# Patient Record
Sex: Female | Born: 1940 | Race: White | Hispanic: No | Marital: Married | Smoking: Current every day smoker
Health system: Southern US, Community
[De-identification: ages and names within clinical notes are randomized; demographics above are authoritative.]

## PROBLEM LIST (undated history)

## (undated) DIAGNOSIS — D219 Benign neoplasm of connective and other soft tissue, unspecified: Secondary | ICD-10-CM

## (undated) DIAGNOSIS — E785 Hyperlipidemia, unspecified: Secondary | ICD-10-CM

## (undated) DIAGNOSIS — N952 Postmenopausal atrophic vaginitis: Secondary | ICD-10-CM

## (undated) DIAGNOSIS — M8580 Other specified disorders of bone density and structure, unspecified site: Secondary | ICD-10-CM

## (undated) DIAGNOSIS — K5790 Diverticulosis of intestine, part unspecified, without perforation or abscess without bleeding: Secondary | ICD-10-CM

## (undated) HISTORY — PX: OTHER SURGICAL HISTORY: SHX169

## (undated) HISTORY — PX: WISDOM TOOTH EXTRACTION: SHX21

## (undated) HISTORY — DX: Benign neoplasm of connective and other soft tissue, unspecified: D21.9

## (undated) HISTORY — DX: Postmenopausal atrophic vaginitis: N95.2

## (undated) HISTORY — DX: Hyperlipidemia, unspecified: E78.5

## (undated) HISTORY — DX: Diverticulosis of intestine, part unspecified, without perforation or abscess without bleeding: K57.90

## (undated) HISTORY — PX: TONSILLECTOMY: SUR1361

## (undated) HISTORY — DX: Other specified disorders of bone density and structure, unspecified site: M85.80

---

## 1990-03-12 HISTORY — PX: ABDOMINAL HYSTERECTOMY: SHX81

## 1997-09-30 ENCOUNTER — Other Ambulatory Visit: Admission: RE | Admit: 1997-09-30 | Discharge: 1997-09-30 | Payer: Self-pay | Admitting: Obstetrics and Gynecology

## 1998-10-03 ENCOUNTER — Other Ambulatory Visit: Admission: RE | Admit: 1998-10-03 | Discharge: 1998-10-03 | Payer: Self-pay | Admitting: Obstetrics and Gynecology

## 2000-10-02 ENCOUNTER — Other Ambulatory Visit: Admission: RE | Admit: 2000-10-02 | Discharge: 2000-10-02 | Payer: Self-pay | Admitting: Obstetrics and Gynecology

## 2000-11-15 ENCOUNTER — Encounter: Payer: Self-pay | Admitting: Internal Medicine

## 2000-11-15 ENCOUNTER — Ambulatory Visit (HOSPITAL_COMMUNITY): Admission: RE | Admit: 2000-11-15 | Discharge: 2000-11-15 | Payer: Self-pay | Admitting: Internal Medicine

## 2000-11-19 ENCOUNTER — Encounter: Payer: Self-pay | Admitting: Internal Medicine

## 2003-09-29 ENCOUNTER — Other Ambulatory Visit: Admission: RE | Admit: 2003-09-29 | Discharge: 2003-09-29 | Payer: Self-pay | Admitting: Obstetrics and Gynecology

## 2004-01-17 ENCOUNTER — Ambulatory Visit: Payer: Self-pay | Admitting: Internal Medicine

## 2004-09-29 ENCOUNTER — Other Ambulatory Visit: Admission: RE | Admit: 2004-09-29 | Discharge: 2004-09-29 | Payer: Self-pay | Admitting: Obstetrics and Gynecology

## 2005-03-12 HISTORY — PX: COLONOSCOPY: SHX174

## 2005-12-27 ENCOUNTER — Ambulatory Visit: Payer: Self-pay | Admitting: Internal Medicine

## 2006-10-17 ENCOUNTER — Other Ambulatory Visit: Admission: RE | Admit: 2006-10-17 | Discharge: 2006-10-17 | Payer: Self-pay | Admitting: Obstetrics and Gynecology

## 2006-11-08 ENCOUNTER — Encounter (INDEPENDENT_AMBULATORY_CARE_PROVIDER_SITE_OTHER): Payer: Self-pay | Admitting: *Deleted

## 2006-12-05 ENCOUNTER — Ambulatory Visit: Payer: Self-pay | Admitting: Internal Medicine

## 2006-12-05 DIAGNOSIS — E782 Mixed hyperlipidemia: Secondary | ICD-10-CM | POA: Insufficient documentation

## 2006-12-05 LAB — CONVERTED CEMR LAB
Cholesterol, target level: 200 mg/dL
HDL goal, serum: 40 mg/dL
LDL Goal: 160 mg/dL

## 2006-12-20 ENCOUNTER — Ambulatory Visit: Payer: Self-pay | Admitting: Internal Medicine

## 2006-12-23 ENCOUNTER — Encounter: Payer: Self-pay | Admitting: Internal Medicine

## 2007-01-06 ENCOUNTER — Encounter (INDEPENDENT_AMBULATORY_CARE_PROVIDER_SITE_OTHER): Payer: Self-pay | Admitting: *Deleted

## 2007-01-07 ENCOUNTER — Encounter (INDEPENDENT_AMBULATORY_CARE_PROVIDER_SITE_OTHER): Payer: Self-pay | Admitting: *Deleted

## 2007-06-03 ENCOUNTER — Ambulatory Visit: Payer: Self-pay | Admitting: Internal Medicine

## 2007-06-03 LAB — CONVERTED CEMR LAB
Fecal Occult Blood: NEGATIVE
OCCULT 1: NEGATIVE
OCCULT 2: NEGATIVE
OCCULT 3: NEGATIVE
OCCULT 4: NEGATIVE
OCCULT 5: NEGATIVE

## 2007-06-09 ENCOUNTER — Encounter (INDEPENDENT_AMBULATORY_CARE_PROVIDER_SITE_OTHER): Payer: Self-pay | Admitting: *Deleted

## 2007-08-08 ENCOUNTER — Ambulatory Visit: Payer: Self-pay | Admitting: Endocrinology

## 2007-08-08 ENCOUNTER — Encounter: Payer: Self-pay | Admitting: Internal Medicine

## 2007-08-12 ENCOUNTER — Ambulatory Visit: Payer: Self-pay | Admitting: Family Medicine

## 2007-08-12 LAB — CONVERTED CEMR LAB
Glucose, Urine, Semiquant: NEGATIVE
Ketones, urine, test strip: NEGATIVE
Specific Gravity, Urine: <1.005
pH: 7.5

## 2007-08-15 ENCOUNTER — Encounter: Admission: RE | Admit: 2007-08-15 | Discharge: 2007-08-15 | Payer: Self-pay | Admitting: Family Medicine

## 2007-08-18 ENCOUNTER — Encounter (INDEPENDENT_AMBULATORY_CARE_PROVIDER_SITE_OTHER): Payer: Self-pay | Admitting: *Deleted

## 2007-08-26 ENCOUNTER — Telehealth (INDEPENDENT_AMBULATORY_CARE_PROVIDER_SITE_OTHER): Payer: Self-pay | Admitting: *Deleted

## 2007-09-05 ENCOUNTER — Ambulatory Visit: Payer: Self-pay | Admitting: Internal Medicine

## 2007-09-05 DIAGNOSIS — M949 Disorder of cartilage, unspecified: Secondary | ICD-10-CM

## 2007-09-05 DIAGNOSIS — M899 Disorder of bone, unspecified: Secondary | ICD-10-CM | POA: Insufficient documentation

## 2007-09-05 LAB — CONVERTED CEMR LAB
Cholesterol, target level: 200 mg/dL
HDL goal, serum: 50 mg/dL
LDL Goal: 160 mg/dL

## 2007-09-09 ENCOUNTER — Telehealth (INDEPENDENT_AMBULATORY_CARE_PROVIDER_SITE_OTHER): Payer: Self-pay | Admitting: *Deleted

## 2007-09-10 ENCOUNTER — Encounter: Admission: RE | Admit: 2007-09-10 | Discharge: 2007-09-10 | Payer: Self-pay | Admitting: Internal Medicine

## 2007-09-15 ENCOUNTER — Ambulatory Visit: Payer: Self-pay | Admitting: Internal Medicine

## 2007-09-15 LAB — CONVERTED CEMR LAB
ALT: 12 units/L (ref 0–35)
Basophils Absolute: 0 10*3/uL (ref 0.0–0.1)
Basophils Relative: 0.6 % (ref 0.0–1.0)
CO2: 30 meq/L (ref 19–32)
Calcium: 9.1 mg/dL (ref 8.4–10.5)
Cholesterol: 233 mg/dL (ref 0–200)
Creatinine, Ser: 0.9 mg/dL (ref 0.4–1.2)
Direct LDL: 127.2 mg/dL
Free T4: 1 ng/dL (ref 0.6–1.6)
GFR calc Af Amer: 81 mL/min
HCT: 44.2 % (ref 36.0–46.0)
Hemoglobin: 15 g/dL (ref 12.0–15.0)
Lymphocytes Relative: 33.1 % (ref 12.0–46.0)
MCHC: 33.9 g/dL (ref 30.0–36.0)
MCV: 97.9 fL (ref 78.0–100.0)
Monocytes Absolute: 0.5 10*3/uL (ref 0.1–1.0)
Neutro Abs: 3.4 10*3/uL (ref 1.4–7.7)
RBC: 4.51 M/uL (ref 3.87–5.11)
RDW: 12.7 % (ref 11.5–14.6)
TSH: 0.97 microintl units/mL (ref 0.35–5.50)
Total Bilirubin: 1 mg/dL (ref 0.3–1.2)
Total Protein: 6.6 g/dL (ref 6.0–8.3)
VLDL: 14 mg/dL (ref 0–40)

## 2007-09-22 ENCOUNTER — Encounter (INDEPENDENT_AMBULATORY_CARE_PROVIDER_SITE_OTHER): Payer: Self-pay | Admitting: *Deleted

## 2007-09-23 ENCOUNTER — Encounter: Payer: Self-pay | Admitting: Internal Medicine

## 2007-10-20 ENCOUNTER — Other Ambulatory Visit: Admission: RE | Admit: 2007-10-20 | Discharge: 2007-10-20 | Payer: Self-pay | Admitting: Obstetrics and Gynecology

## 2007-10-29 ENCOUNTER — Encounter: Payer: Self-pay | Admitting: Internal Medicine

## 2007-11-19 ENCOUNTER — Encounter: Payer: Self-pay | Admitting: Internal Medicine

## 2007-12-11 ENCOUNTER — Encounter: Payer: Self-pay | Admitting: Internal Medicine

## 2008-02-09 ENCOUNTER — Encounter (INDEPENDENT_AMBULATORY_CARE_PROVIDER_SITE_OTHER): Payer: Self-pay | Admitting: *Deleted

## 2008-02-13 ENCOUNTER — Ambulatory Visit: Payer: Self-pay | Admitting: Internal Medicine

## 2008-04-01 ENCOUNTER — Ambulatory Visit: Payer: Self-pay | Admitting: Internal Medicine

## 2008-04-01 DIAGNOSIS — M2550 Pain in unspecified joint: Secondary | ICD-10-CM | POA: Insufficient documentation

## 2008-04-06 ENCOUNTER — Encounter (INDEPENDENT_AMBULATORY_CARE_PROVIDER_SITE_OTHER): Payer: Self-pay | Admitting: *Deleted

## 2008-04-06 LAB — CONVERTED CEMR LAB: Uric Acid, Serum: 3.8 mg/dL (ref 2.4–7.0)

## 2008-05-17 ENCOUNTER — Telehealth: Payer: Self-pay | Admitting: Internal Medicine

## 2008-10-22 ENCOUNTER — Ambulatory Visit: Payer: Self-pay | Admitting: Obstetrics and Gynecology

## 2009-01-06 ENCOUNTER — Encounter: Payer: Self-pay | Admitting: Internal Medicine

## 2009-03-17 ENCOUNTER — Ambulatory Visit: Payer: Self-pay | Admitting: Internal Medicine

## 2009-03-17 DIAGNOSIS — K5730 Diverticulosis of large intestine without perforation or abscess without bleeding: Secondary | ICD-10-CM | POA: Insufficient documentation

## 2009-03-18 ENCOUNTER — Ambulatory Visit: Payer: Self-pay | Admitting: Internal Medicine

## 2009-03-18 LAB — CONVERTED CEMR LAB: Vit D, 25-Hydroxy: 41 ng/mL (ref 30–89)

## 2009-03-19 LAB — CONVERTED CEMR LAB
ALT: 14 units/L (ref 0–35)
AST: 18 units/L (ref 0–37)
Albumin: 3.8 g/dL (ref 3.5–5.2)
BUN: 12 mg/dL (ref 6–23)
Basophils Relative: 0.6 % (ref 0.0–3.0)
Chloride: 109 meq/L (ref 96–112)
Cholesterol: 228 mg/dL — ABNORMAL HIGH (ref 0–200)
Eosinophils Relative: 5 % (ref 0.0–5.0)
Glucose, Bld: 91 mg/dL (ref 70–99)
HCT: 44.1 % (ref 36.0–46.0)
Hemoglobin: 14.6 g/dL (ref 12.0–15.0)
Lymphs Abs: 2.2 10*3/uL (ref 0.7–4.0)
MCV: 98.8 fL (ref 78.0–100.0)
Monocytes Absolute: 0.5 10*3/uL (ref 0.1–1.0)
Neutro Abs: 3.2 10*3/uL (ref 1.4–7.7)
Potassium: 4.1 meq/L (ref 3.5–5.1)
RBC: 4.46 M/uL (ref 3.87–5.11)
WBC: 6.2 10*3/uL (ref 4.5–10.5)

## 2009-03-21 ENCOUNTER — Encounter (INDEPENDENT_AMBULATORY_CARE_PROVIDER_SITE_OTHER): Payer: Self-pay | Admitting: *Deleted

## 2009-03-30 ENCOUNTER — Encounter: Payer: Self-pay | Admitting: Internal Medicine

## 2009-10-20 ENCOUNTER — Encounter (INDEPENDENT_AMBULATORY_CARE_PROVIDER_SITE_OTHER): Payer: Self-pay | Admitting: *Deleted

## 2009-10-24 ENCOUNTER — Telehealth: Payer: Self-pay | Admitting: Gastroenterology

## 2009-12-14 ENCOUNTER — Encounter: Payer: Self-pay | Admitting: Internal Medicine

## 2009-12-15 ENCOUNTER — Ambulatory Visit: Payer: Self-pay | Admitting: Family Medicine

## 2009-12-19 ENCOUNTER — Other Ambulatory Visit: Admission: RE | Admit: 2009-12-19 | Discharge: 2009-12-19 | Payer: Self-pay | Admitting: Obstetrics and Gynecology

## 2009-12-19 ENCOUNTER — Ambulatory Visit: Payer: Self-pay | Admitting: Obstetrics and Gynecology

## 2010-04-11 NOTE — Progress Notes (Signed)
Summary: ? re recall col  Phone Note Call from Patient Call back at Home Phone [phone]   Caller: Patient Call For: Russella Dar Reason for Call: Talk to Nurse Summary of Call: Patient wants to speak to nurse regarding recall col, states that she was under the impression that if you had no polyp or hx the recall was for ten years not five. Initial call taken by: Tawni Levy,  October 24, 2009 3:41 PM  Follow-up for Phone Call        Reviewed prior colon from 11/2003 with patient was a mederately dificult exam and that is why the recommendation for a 5 year recall.  Patient  states she will think about it and call back if she is interested. Follow-up by: Darcey Nora RN, CGRN,  October 24, 2009 3:55 PM

## 2010-04-11 NOTE — Assessment & Plan Note (Signed)
Summary: cpx/ns/kdc   Vital Signs:  Patient profile:   70 year old female Height:      63 inches Weight:      125.6 pounds BMI:     22.33 Temp:     98.6 degrees F oral Resp:     14 per minute BP sitting:   122 / 80  (left arm) Cuff size:   regular  Vitals Entered By: Shonna Chock (March 17, 2009 1:46 PM)  Comments REVIEWED MED LIST, PATIENT AGREED DOSE AND INSTRUCTION CORRECT    Primary Care Provider:  Alwyn Ren   History of Present Illness: Karen Mccoy is here for a preventive care exam; she is asymptomatic.  Preventive Screening-Counseling & Management  Alcohol-Tobacco     Smoking Status: current  Caffeine-Diet-Exercise     Does Patient Exercise: yes  Allergies: 1)  ! Pcn  Past History:  Past Medical History: Osteopenia : Tscore -2.2 @ R femoral neck 01/06/2009 , Dr Eda Paschal (improved @ all sites) Aortic bruit w/o AAA; Thyroid nodule, S/P tonsilar irradiation Diverticulosis, colon Hyperlipidemia: LDL 161(0960/454), TG 94, HDL 78; LDL goal = < 160, ideally < 130  Past Surgical History: Colonoscopy : tics  ;Hysterectomy for fibroid ( ovaries remain) Tonsillectomy; G2 P2  Family History: Father: lipids, CAD ,CBAG,pacer, died 2008-12-07 Resp Failure, CHF, Renal Failure Mother: lipids, HTN,osteopenia Siblings:  bro lipids MGF MI in 46s; MGM MI pre 31; PGM COAD  Social History: heart healthy diet Retired Married Current Smoker: 3-4 / day Alcohol use-yes: socially  Regular exercise-yes: 3-5X /week as tennis (doubles)  Review of Systems       The patient complains of decreased hearing.  The patient denies anorexia, fever, weight loss, weight gain, vision loss, hoarseness, chest pain, syncope, dyspnea on exertion, peripheral edema, prolonged cough, headaches, hemoptysis, abdominal pain, melena, hematochezia, severe indigestion/heartburn, hematuria, incontinence, suspicious skin lesions, depression, unusual weight change, abnormal bleeding, enlarged lymph nodes, and  angioedema.         L ear hearing loss by 1/3 post infections.  Physical Exam  General:  Thin but well-nourished,in no acute distress; alert,appropriate and cooperative throughout examination Head:  Normocephalic and atraumatic without obvious abnormalities.  Eyes:  No corneal or conjunctival inflammation noted. Perrla. Funduscopic exam benign, without hemorrhages, exudates or papilledema.  Ears:  External ear exam shows no significant lesions or deformities.  Otoscopic examination reveals clear canals, tympanic membranes are intact bilaterally without bulging, retraction, inflammation or discharge. L TM  dull Nose:  External nasal examination shows no deformity or inflammation. Nasal mucosa are pink and moist without lesions or exudates. Mouth:  Oral mucosa and oropharynx without lesions or exudates.  Teeth in good repair. Neck:  No deformities, masses, or tenderness noted. NO NODULES  Lungs:  Normal respiratory effort, chest expands symmetrically. Lungs are clear to auscultation, no crackles or wheezes. Heart:  Normal rate and regular rhythm. S1 and S2 normal without gallop, murmur, click, rub . S4 with slurring Abdomen:  Bowel sounds positive,abdomen soft and non-tender without masses, organomegaly or hernias noted. No bruit Genitalia:  Dr Eda Paschal Msk:  No deformity or scoliosis noted of thoracic or lumbar spine.   Pulses:  R and L carotid,radial,dorsalis pedis and posterior tibial pulses are full and equal bilaterally Extremities:  No clubbing, cyanosis, edema, or deformity noted with normal full range of motion of all joints.   Neurologic:  alert & oriented X3 and DTRs symmetrical and normal.   Skin:  Intact without suspicious lesions or rashes Cervical Nodes:  No lymphadenopathy noted Axillary Nodes:  No palpable lymphadenopathy Psych:  memory intact for recent and remote, normally interactive, and good eye contact.     Impression & Recommendations:  Problem # 1:  PREVENTIVE  HEALTH CARE (ICD-V70.0)  Orders: EKG w/ Interpretation (93000)  Problem # 2:  OSTEOPENIA (ICD-733.90)  Problem # 3:  HYPERLIPIDEMIA (ICD-272.2)  Orders: EKG w/ Interpretation (93000)  Problem # 4:  THYROID NODULE, HX OF (ICD-V12.2) Not clinically present; PMH of tonsillar irradiation  Complete Medication List: 1)  Alprazolam 0.25 Mg Tabs (Alprazolam) .... 1/2 -1 q 8-12 hrs as needed only, do not take routinely 2)  Vagifem 10 Mcg Tabs (Estradiol) .... 4 per week  Patient Instructions: 1)  Schedule fasting labs: 2)  BMP ; 3)  Hepatic Panel; 4)  Lipid Panel ; 5)  TSH ;free T4; 6)  CBC w/ Diff; vitamin D level. Prescriptions: ALPRAZOLAM 0.25 MG TABS (ALPRAZOLAM) 1/2 -1 q 8-12 hrs as needed only, do not take routinely  #90 x 1   Entered and Authorized by:   Marga Melnick MD   Signed by:   Marga Melnick MD on 03/17/2009   Method used:   Print then Give to Patient   RxID:   [id number]

## 2010-04-11 NOTE — Letter (Signed)
Summary: Results Follow up Letter  [place] [facility]  [address] [place], [place] [address]   Phone: [phone]  Fax: [phone]    03/21/2009 MRN: [id number]  JANUS VLCEK [address] [place], [place]  [address]  Dear Ms. Vandenheuvel,  The following are the results of your recent test(s):  Test         Result    Pap Smear:        Normal _____  Not Normal _____ Comments: ______________________________________________________ Cholesterol: LDL(Bad cholesterol):         Your goal is less than:         HDL (Good cholesterol):       Your goal is more than: Comments:  ______________________________________________________ Mammogram:        Normal _____  Not Normal _____ Comments:  ___________________________________________________________________ Hemoccult:        Normal _____  Not normal _______ Comments:    _____________________________________________________________________ Other Tests: PLEASE SEE COPY OF LABS FROM 03/18/09 AND COMMENTS    We routinely do not discuss normal results over the telephone.  If you desire a copy of the results, or you have any questions about this information we can discuss them at your next office visit.   Sincerely,

## 2010-04-11 NOTE — Letter (Signed)
Summary: Colonoscopy Letter   Gastroenterology  [address] [place], [place] [address]   Phone: [phone]  Fax: [phone]      October 20, 2009 MRN: [id number]   Karen Mccoy [address] [place], [place]  [address]   Dear Ms. Edgell,   According to your medical record, it is time for you to schedule a Colonoscopy. The American Cancer Society recommends this procedure as a method to detect early colon cancer. Patients with a family history of colon cancer, or a personal history of colon polyps or inflammatory bowel disease are at increased risk.  This letter has beeen generated based on the recommendations made at the time of your procedure. If you feel that in your particular situation this may no longer apply, please contact our office.  Please call our office at [phone] to schedule this appointment or to update your records at your earliest convenience.  Thank you for cooperating with [place] to provide you with the very best care possible.   Sincerely,  Judie Petit T. Russella Dar, M.D.  [facility] [phone]

## 2010-04-11 NOTE — Miscellaneous (Signed)
Summary: Health Care Power of [facility] Power of Attorney   Imported By: Lanelle Bal 06/24/2009 08:33:45  _____________________________________________________________________  External Attachment:    Type:   Image     Comment:   External Document

## 2010-04-11 NOTE — Miscellaneous (Signed)
Summary: Desire for a Natural Death  Desire for a Natural Death   Imported By: Lanelle Bal 03/23/2009 12:57:20  _____________________________________________________________________  External Attachment:    Type:   Image     Comment:   External Document

## 2010-04-11 NOTE — Miscellaneous (Signed)
Summary: Living Will  Living Will   Imported By: Lanelle Bal 06/24/2009 08:36:44  _____________________________________________________________________  External Attachment:    Type:   Image     Comment:   External Document

## 2010-04-11 NOTE — Assessment & Plan Note (Signed)
Summary: broken toe? /cbs   Vital Signs:  Patient profile:   70 year old female Height:      63 inches Weight:      118 pounds BMI:     20.98 Pulse rate:   82 / minute BP sitting:   112 / 70  (left arm)  Vitals Entered By: Doristine Devoid CMA (December 15, 2009 8:53 AM) CC: 2nd toe on L foot painful   History of Present Illness: 70 yo woman here today w/ L toe pain.  tripped on the rug and 'toe crumpled'.  occured last night.  toe was throbbing 'all night' and is now 'black and blue'.  iced area this AM.  hasn't taken anything for pain.  wants to know if she can play tennis this weekend.  Current Medications (verified): 1)  Alprazolam 0.25 Mg Tabs (Alprazolam) .... 1/2 -1 Q 8-12 Hrs As Needed Only, Do Not Take Routinely 2)  Vagifem 10 Mcg Tabs (Estradiol) .... 4 Per Week 3)  Naprosyn 500 Mg Tabs (Naproxen) .Marland Kitchen.. 1 Two Times A Day X7 Days and Then As Needed.  Take W/ Food.  Allergies (verified): 1)  ! Pcn  Review of Systems      See HPI  Physical Exam  General:  Thin but well-nourished,in no acute distress; alert,appropriate and cooperative throughout examination Pulses:  +2 DP/PT pulses Extremities:  bruising and mild swelling of L 2nd toe just below nail.  no dislocation, no angulation, good mobility of DIP/PIP joints   Impression & Recommendations:  Problem # 1:  TOE PAIN (ICD-729.5) Assessment New  fx vs bruise.  offered pt xray- she declined.  start NSAIDs, ice.  discussed ability to play this weekend- if able to make cuts laterally she can play, otherwise will need to stop.  Pt expresses understanding and is in agreement w/ this plan.  Orders: Prescription Created Electronically [phone])  Complete Medication List: 1)  Alprazolam 0.25 Mg Tabs (Alprazolam) .... 1/2 -1 q 8-12 hrs as needed only, do not take routinely 2)  Vagifem 10 Mcg Tabs (Estradiol) .... 4 per week 3)  Naprosyn 500 Mg Tabs (Naproxen) .Marland Kitchen.. 1 two times a day x7 days and then as needed.  take w/  food.  Other Orders: Pneumococcal Vaccine (02542) Admin 1st Vaccine (70623)  Patient Instructions: 1)  This appears to be a bruised toe 2)  Ice for 15-20 minutes as often as possible 3)  Take the Naprosyn as directed for pain and inflammation- take w/ food to avoid upset stomach 4)  Tylenol in between doses as needed for pain 5)  Keep toe protected w/ sturdy shoes 6)  If you are able to cut from side to side and front to back tomorrow morning there should be no reason you can't play.  But if it is too uncomfortable, maybe sit this weekend out! 7)  Hang in there!!! Prescriptions: NAPROSYN 500 MG TABS (NAPROXEN) 1 two times a day x7 days and then as needed.  take w/ food.  #60 x 0   Entered and Authorized by:   Neena Rhymes MD   Signed by:   Neena Rhymes MD on 12/15/2009   Method used:   Electronically to        [facility]  [phone]* (retail)       [address] [place], [place]  [address]       Ph: [phone] or [phone]       Fax:  [phone]   RxID:   [id number]    Immunization History:  Tetanus/Td Immunization History:    Tetanus/Td:  historical (12/05/2009)  Immunizations Administered:  Pneumonia Vaccine:    Vaccine Type: Pneumovax    Site: left deltoid    Mfr: Merck    Dose: 0.5 ml    Route: IM    Given by: Doristine Devoid CMA    Exp. Date: 05/29/2011    Lot #: [id number]

## 2010-04-14 ENCOUNTER — Encounter (INDEPENDENT_AMBULATORY_CARE_PROVIDER_SITE_OTHER): Payer: Self-pay | Admitting: *Deleted

## 2010-04-14 ENCOUNTER — Encounter: Payer: Self-pay | Admitting: Internal Medicine

## 2010-04-14 ENCOUNTER — Other Ambulatory Visit (INDEPENDENT_AMBULATORY_CARE_PROVIDER_SITE_OTHER): Payer: Medicare Other

## 2010-04-14 ENCOUNTER — Ambulatory Visit: Admit: 2010-04-14 | Payer: Self-pay | Admitting: Internal Medicine

## 2010-04-14 ENCOUNTER — Other Ambulatory Visit: Payer: Self-pay | Admitting: Internal Medicine

## 2010-04-14 DIAGNOSIS — Z862 Personal history of diseases of the blood and blood-forming organs and certain disorders involving the immune mechanism: Secondary | ICD-10-CM

## 2010-04-14 DIAGNOSIS — E782 Mixed hyperlipidemia: Secondary | ICD-10-CM

## 2010-04-14 DIAGNOSIS — Z0000 Encounter for general adult medical examination without abnormal findings: Secondary | ICD-10-CM

## 2010-04-14 DIAGNOSIS — Z8639 Personal history of other endocrine, nutritional and metabolic disease: Secondary | ICD-10-CM

## 2010-04-14 DIAGNOSIS — E785 Hyperlipidemia, unspecified: Secondary | ICD-10-CM

## 2010-04-14 LAB — CBC WITH DIFFERENTIAL/PLATELET
Basophils Absolute: 0 10*3/uL (ref 0.0–0.1)
Basophils Relative: 0.5 % (ref 0.0–3.0)
Eosinophils Absolute: 0.4 10*3/uL (ref 0.0–0.7)
HCT: 45.2 % (ref 36.0–46.0)
Hemoglobin: 15.4 g/dL — ABNORMAL HIGH (ref 12.0–15.0)
Lymphs Abs: 2.3 10*3/uL (ref 0.7–4.0)
MCHC: 34.2 g/dL (ref 30.0–36.0)
MCV: 96.8 fl (ref 78.0–100.0)
Neutro Abs: 3.9 10*3/uL (ref 1.4–7.7)
RBC: 4.67 Mil/uL (ref 3.87–5.11)
RDW: 13.9 % (ref 11.5–14.6)

## 2010-04-14 LAB — BASIC METABOLIC PANEL
Calcium: 9.2 mg/dL (ref 8.4–10.5)
Chloride: 106 mEq/L (ref 96–112)
Creatinine, Ser: 0.8 mg/dL (ref 0.4–1.2)
GFR: 78.15 mL/min (ref >60.00)

## 2010-04-14 LAB — LIPID PANEL
Total CHOL/HDL Ratio: 3
VLDL: 13.8 mg/dL (ref 0.0–40.0)

## 2010-04-14 LAB — HEPATIC FUNCTION PANEL
Alkaline Phosphatase: 51 U/L (ref 39–117)
Bilirubin, Direct: 0.2 mg/dL (ref 0.0–0.3)
Total Bilirubin: 0.6 mg/dL (ref 0.3–1.2)
Total Protein: 6.4 g/dL (ref 6.0–8.3)

## 2010-04-26 ENCOUNTER — Encounter: Payer: Self-pay | Admitting: Internal Medicine

## 2010-04-26 ENCOUNTER — Encounter (INDEPENDENT_AMBULATORY_CARE_PROVIDER_SITE_OTHER): Payer: Medicare Other | Admitting: Internal Medicine

## 2010-04-26 DIAGNOSIS — Z136 Encounter for screening for cardiovascular disorders: Secondary | ICD-10-CM

## 2010-04-26 DIAGNOSIS — R6884 Jaw pain: Secondary | ICD-10-CM

## 2010-04-26 DIAGNOSIS — R9431 Abnormal electrocardiogram [ECG] [EKG]: Secondary | ICD-10-CM

## 2010-04-26 DIAGNOSIS — Z0000 Encounter for general adult medical examination without abnormal findings: Secondary | ICD-10-CM

## 2010-04-26 DIAGNOSIS — M949 Disorder of cartilage, unspecified: Secondary | ICD-10-CM

## 2010-04-26 DIAGNOSIS — E782 Mixed hyperlipidemia: Secondary | ICD-10-CM

## 2010-04-28 ENCOUNTER — Encounter: Payer: Self-pay | Admitting: Physician Assistant

## 2010-04-28 ENCOUNTER — Encounter (INDEPENDENT_AMBULATORY_CARE_PROVIDER_SITE_OTHER): Payer: Medicare Other | Admitting: Physician Assistant

## 2010-04-28 DIAGNOSIS — R9431 Abnormal electrocardiogram [ECG] [EKG]: Secondary | ICD-10-CM

## 2010-05-03 NOTE — Assessment & Plan Note (Signed)
Summary: yearly exam and discuss labs/sph   Vital Signs:  Patient profile:   70 year old female Height:      63.25 inches Weight:      118.4 pounds BMI:     20.88 Temp:     98.9 degrees F oral Pulse rate:   76 / minute Resp:     14 per minute BP sitting:   110 / 68  (left arm) Cuff size:   regular  Vitals Entered By: Shonna Chock CMA (April 26, 2010 10:57 AM) CC: CPX, discuss labs (copies given)   Vision Screening:      Vision Comments: Recent eye exam at [facility] 02/2010   Primary Care Provider:  Alwyn Ren  CC:  CPX and discuss labs (copies given) .  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  Referral Coordination: Bruce Stress Test because of jaw pain & new T changes III & aVF 12.  Care Plan:see Instructions 13. Cognitive Assessment:Oriented X3; memory & recall excellent   ; "WORLD" spelled backwards; mood & affect normal.                                                                                                                                                    See EKG: asymptomatic T wave inversion III & aVF , not present 03/2009. Dr Toni Arthurs, DDS , has found no dental etiology for jaw pain intermittently.   Preventive Screening-Counseling & Management  Alcohol-Tobacco     Alcohol drinks/day: 1-2     Smoking Status: current     Packs/Day: 1/5 of pack     Year Started: 1957  Caffeine-Diet-Exercise     Caffeine use/day: 2 cups coffee; 1 glass  tea daily    Hep-HIV-STD-Contraception     Dental Visit-last 6 months yes     Sun Exposure-Excessive: no  Safety-Violence-Falls     Seat Belt Use: yes     Smoke Detectors: yes      Blood Transfusions:  no.        Travel History:  2008 [place].    Current Medications (verified): 1)  Alprazolam 0.25 Mg Tabs (Alprazolam) .... 1/2 -1 Q 8-12 Hrs As Needed Only, Do Not Take Routinely 2)  Vagifem 10 Mcg Tabs (Estradiol) .... 4 Per Week  Allergies: 1)  ! Pcn  Past History:  Past Medical History: Osteopenia : Tscore -2.2 @ R femoral neck 01/06/2009 , Dr Dannielle Huh  [place] ( T scores improved @ all sites) Aortic bruit w/o AAA;  S/P tonsilar irradiation @ age 50 Diverticulosis, colon Hyperlipidemia:NMR Lipoprofile : LDL 142 (1174/542), TG 94, HDL 78; LDL goal = < 160, ideally < 130  Past Surgical History: Colonoscopy 2005 : tics, Dr Claudette Head  ;Hysterectomy for uterine  fibroid ( ovaries [phone], Dr Eda Paschal Tonsillectomy; G2 P2  Family History: Father:elevated  lipids, CAD ,CBAG,pacer, died December 14, 2008 Resp Failure, CHF, Renal Failure Mother:elevated  lipids, HTN,osteopenia Siblings:  NW:GNFAOZHY  lipids MGF: MI in 41s; MGM : MI pre 69; PGM: COAD  Social History: heart healthy diet Retired Married Current Smoker: 3-4 cigarettes / day Alcohol use-yes: socially  Regular exercise-yes: 4-5 X /week as tennis (doubles) for > 60 min w/o symptoms Packs/Day:  1/5 of pack Caffeine use/day:  2 cups coffee; 1 glass  tea daily  Dental Care w/in 6 mos.:  yes Sun Exposure-Excessive:  no Seat Belt Use:  yes Blood Transfusions:  no  Review of Systems  The patient denies anorexia, fever, weight loss, weight gain, vision loss, hoarseness, chest pain, syncope, dyspnea on exertion, peripheral edema, prolonged cough, headaches, hemoptysis, abdominal pain, melena, hematochezia, severe indigestion/heartburn, hematuria, suspicious skin lesions, depression, unusual weight change, abnormal bleeding,  enlarged lymph nodes, and angioedema.         She has non exertional  pain in jaws rarely ; evaluation including Panorex  by Dr Toni Arthurs was negative. Occasioanl am neck pain despite changing pillows  Physical Exam  General:  well-nourished;alert,appropriate and cooperative throughout examination Head:  Normocephalic and atraumatic without obvious abnormalities.  Eyes:  No corneal or conjunctival inflammation noted. Perrla. Funduscopic exam benign, without hemorrhages, exudates or papilledema.  Ears:  External ear exam shows no significant lesions or deformities.  Otoscopic examination reveals clear canals, tympanic membranes are intact bilaterally without bulging, retraction, inflammation or discharge. Hearing is grossly normal bilaterally. Nose:  External nasal examination shows no deformity or inflammation. Nasal mucosa are pink and moist without lesions or exudates. Mouth:  Oral mucosa and oropharynx without lesions or exudates.  Teeth in good repair. Neck:  No deformities, masses, or tenderness noted. R thyroid lobe small; no nodules Lungs:  Normal respiratory effort, chest expands symmetrically. Lungs are clear to auscultation, no crackles or wheezes. Heart:  Normal rate and regular rhythm. S1 and S2 normal without gallop, murmur, click, rub . Soft S4 with slurring Abdomen:  Bowel sounds positive,abdomen soft and non-tender without masses, organomegaly or hernias noted. Rectal:  given stool cards Genitalia:  Dr Eda Paschal Msk:  No deformity or scoliosis noted of thoracic or lumbar spine.   Pulses:  R and L carotid,radial,dorsalis pedis and posterior tibial pulses are full and equal bilaterally Extremities:  No clubbing, cyanosis, edema, or deformity noted with normal full range of motion of all joints.   Neurologic:  alert & oriented X3 and DTRs symmetrical and normal.   Skin:  Intact without suspicious lesions or rashes Cervical Nodes:  No lymphadenopathy noted Axillary Nodes:  No  palpable lymphadenopathy Psych:  memory intact for recent and remote, normally interactive, and good eye contact.     Impression & Recommendations:  Problem # 1:  PREVENTIVE HEALTH CARE (ICD-V70.0)  Orders: Medicare -1st Annual Wellness Visit [phone])  Problem # 2:  JAW PAIN (ION-629.52)  neg dental evaluation  Orders: Misc. Referral (Misc. Ref) EKG w/ Interpretation (93000)  Problem # 3:  NONSPECIFIC ABNORMAL ELECTROCARDIOGRAM (ICD-794.31)  T wave inversion III & aVF , new since 03/2009  Orders:  Misc. Referral (Misc. Ref)  Problem # 4:  HYPERLIPIDEMIA (ICD-272.2)  Orders: EKG w/ Interpretation (93000)  Problem # 5:  OSTEOPENIA (ICD-733.90)  Complete Medication List: 1)  Alprazolam 0.25 Mg Tabs (Alprazolam) .... 1/2 -1 q 8-12 hrs as needed only, do not take routinely 2)  Vagifem 10 Mcg Tabs (Estradiol) .... 4 per week  Patient Instructions: 1)   BMD every 25 months.Complete stool cards. 2)  It is important that you exercise regularly at least 20 minutes 5 times a week. If you develop chest pain, have severe difficulty breathing, or feel very tired , stop exercising immediately and seek medical attention. 3)  Complete stool cards. Please  schedule a colonoscopy  if  Dr Russella Dar  feels it is necessary even if stool cards are negative.Positive stool cards would make colonscopy mandatory. 4)  Stop Smoking Tips: Choose a Quit date. Cut down before the Quit date. decide what you will do as a substitute when you feel the urge to smoke(gum,toothpick,exercise).   Orders Added: 1)  Medicare -1st Annual Wellness Visit [G0438] 2)  Est. Patient Level III [[address]] 3)  Misc. Referral [Misc. Ref] 4)  EKG w/ Interpretation [93000]

## 2010-06-09 ENCOUNTER — Other Ambulatory Visit: Payer: Self-pay | Admitting: Internal Medicine

## 2010-06-09 MED ORDER — ALPRAZOLAM 0.25 MG PO TABS: ORAL_TABLET | ORAL | Status: DC

## 2010-06-13 ENCOUNTER — Other Ambulatory Visit: Payer: Self-pay | Admitting: Internal Medicine

## 2010-06-13 ENCOUNTER — Other Ambulatory Visit: Payer: Medicare Other

## 2010-06-13 DIAGNOSIS — Z1289 Encounter for screening for malignant neoplasm of other sites: Secondary | ICD-10-CM

## 2010-06-13 LAB — HEMOCCULT SLIDES (X 3 CARDS)
Fecal Occult Blood: NEGATIVE
OCCULT 2: NEGATIVE
OCCULT 3: NEGATIVE

## 2010-10-31 ENCOUNTER — Other Ambulatory Visit: Payer: Self-pay | Admitting: Orthopedic Surgery

## 2010-12-11 ENCOUNTER — Ambulatory Visit (INDEPENDENT_AMBULATORY_CARE_PROVIDER_SITE_OTHER): Payer: Managed Care, Other (non HMO) | Admitting: Internal Medicine

## 2010-12-11 ENCOUNTER — Encounter: Payer: Self-pay | Admitting: Internal Medicine

## 2010-12-11 DIAGNOSIS — R3915 Urgency of urination: Secondary | ICD-10-CM

## 2010-12-11 DIAGNOSIS — R1032 Left lower quadrant pain: Secondary | ICD-10-CM

## 2010-12-11 DIAGNOSIS — R140 Abdominal distension (gaseous): Secondary | ICD-10-CM

## 2010-12-11 DIAGNOSIS — R143 Flatulence: Secondary | ICD-10-CM

## 2010-12-11 LAB — POCT URINALYSIS DIPSTICK
Bilirubin, UA: NEGATIVE
Blood, UA: NEGATIVE
Ketones, UA: NEGATIVE
pH, UA: 6

## 2010-12-11 NOTE — Progress Notes (Signed)
  Subjective:    Patient ID: Karen Mccoy, female    DOB: [birth year], 70 y.o.   MRN: [id number]  HPI ABDOMINAL PAIN: Location: LLQ  Onset: 3 weeks ago   Radiation: no  Severity: up to 2 Quality: dull  Duration: only with direct pressure  Better with: resolves when pressure removed  Worse with: no other factors Symptoms Nausea/Vomiting: no  Diarrhea: no  Constipation: no  Melena/BRBPR: no   Anorexia: no  Fever/Chills: no  Dysuria/hematuria/pyuria: no, but frequency with oliguria in afternoon  then normal volume thereafter Rash: no  Wt loss: no  Vaginal bleeding: no    Past Surgeries: TAH w/o BSO for uterine fibroid 1992; last colonoscopy 2007: negative except "tortuousity". No PMH of GU issues       Review of Systems   She questions whether she may have injured her abdomen playing tennis. She describes bloating throughout the summer. She her dress size increased from 4 to a 6 during that time.     Objective:   Physical Exam General appearance : thin but  good health and nourishment w/o distress.  Eyes: No conjunctival inflammation or scleral icterus is present.  Oral exam: Dental hygiene is good; lips and gums are healthy appearing.There is no oropharyngeal erythema or exudate noted.   Heart:  Normal rate and regular rhythm. S1 and S2 normal without gallop, murmur, click, rub or other extra sounds     Lungs:Chest clear to auscultation; no wheezes, rhonchi,rales ,or rubs present.No increased work of breathing.   Abdomen: bowel sounds normal, soft  But tender  LLQ without masses, organomegaly or hernias noted.  No guarding or rebound . An aortic bruit is noted; there is no aneurysm.  Skin:Warm & dry.  Intact without suspicious lesions or rashes ; no jaundice or tenting  Lymphatic: No lymphadenopathy is noted about the head, neck, axilla, or inguinal areas.   Musculoskeletal: There is exceptionally good range of motion of the lower extremities with negative straight  leg raising past 90.             Assessment & Plan:  #1 left lower quadrant abdominal pain; this is actually localized tenderness to palpation  #2 bloating for several months with associated increase in dress size  #3 some urgency and temporary oliguria, recurrent  Plan: See orders and recommendations

## 2010-12-11 NOTE — Patient Instructions (Signed)
.  Share results with Dr Eda Paschal .

## 2010-12-12 ENCOUNTER — Telehealth: Payer: Self-pay | Admitting: Internal Medicine

## 2010-12-12 NOTE — Telephone Encounter (Signed)
I do not want to get a transvaginal;can't  they  do ultrasound of abdomen to assess the ovary. If not ; I'll order a  non-OB  Trans vaginal study.

## 2010-12-12 NOTE — Telephone Encounter (Signed)
Dr. Alwyn Ren, for Pelvic ultrasounds, an order for NON-OB TRANS VAGINAL U/S is also required to be entered please.

## 2010-12-13 ENCOUNTER — Other Ambulatory Visit: Payer: Self-pay | Admitting: Internal Medicine

## 2010-12-13 DIAGNOSIS — R1032 Left lower quadrant pain: Secondary | ICD-10-CM

## 2010-12-13 DIAGNOSIS — R140 Abdominal distension (gaseous): Secondary | ICD-10-CM

## 2010-12-13 NOTE — Telephone Encounter (Signed)
Dr. Alwyn Ren,  A Trans Vaginal u/s order was required, order entered by [facility], patient scheduled for 12-14-10, but state you will need to sign off on the U/S Order they entered please.

## 2010-12-14 ENCOUNTER — Ambulatory Visit (HOSPITAL_COMMUNITY)
Admission: RE | Admit: 2010-12-14 | Discharge: 2010-12-14 | Disposition: A | Discharge status: Home / Self Care | Payer: Medicare Other | Source: Ambulatory Visit | Attending: Internal Medicine | Admitting: Internal Medicine

## 2010-12-14 DIAGNOSIS — R142 Eructation: Secondary | ICD-10-CM | POA: Insufficient documentation

## 2010-12-14 DIAGNOSIS — R1032 Left lower quadrant pain: Secondary | ICD-10-CM | POA: Insufficient documentation

## 2010-12-14 DIAGNOSIS — R141 Gas pain: Secondary | ICD-10-CM | POA: Insufficient documentation

## 2010-12-14 DIAGNOSIS — Z90710 Acquired absence of both cervix and uterus: Secondary | ICD-10-CM | POA: Insufficient documentation

## 2010-12-14 DIAGNOSIS — R140 Abdominal distension (gaseous): Secondary | ICD-10-CM

## 2010-12-18 ENCOUNTER — Telehealth: Payer: Self-pay

## 2010-12-18 ENCOUNTER — Encounter: Payer: Self-pay | Admitting: Gynecology

## 2010-12-18 DIAGNOSIS — D219 Benign neoplasm of connective and other soft tissue, unspecified: Secondary | ICD-10-CM | POA: Insufficient documentation

## 2010-12-18 DIAGNOSIS — N952 Postmenopausal atrophic vaginitis: Secondary | ICD-10-CM | POA: Insufficient documentation

## 2010-12-18 NOTE — Telephone Encounter (Signed)
Dr.Hopper spoke with patient:  Dr.Hopper please document conversation

## 2010-12-20 ENCOUNTER — Encounter: Payer: Self-pay | Admitting: Obstetrics and Gynecology

## 2010-12-20 NOTE — Telephone Encounter (Signed)
Dr.Hopper please document conversation with patient and close out encounter

## 2010-12-20 NOTE — Telephone Encounter (Signed)
Results of ultrasound discussed with patient; followup will be with gynecology.

## 2010-12-22 ENCOUNTER — Ambulatory Visit (INDEPENDENT_AMBULATORY_CARE_PROVIDER_SITE_OTHER): Payer: Managed Care, Other (non HMO) | Admitting: Obstetrics and Gynecology

## 2010-12-22 ENCOUNTER — Encounter: Payer: Self-pay | Admitting: Obstetrics and Gynecology

## 2010-12-22 VITALS — BP 116/60 | Ht 63.5 in | Wt 121.0 lb

## 2010-12-22 DIAGNOSIS — M949 Disorder of cartilage, unspecified: Secondary | ICD-10-CM

## 2010-12-22 DIAGNOSIS — M8580 Other specified disorders of bone density and structure, unspecified site: Secondary | ICD-10-CM

## 2010-12-22 DIAGNOSIS — R1032 Left lower quadrant pain: Secondary | ICD-10-CM

## 2010-12-22 DIAGNOSIS — N952 Postmenopausal atrophic vaginitis: Secondary | ICD-10-CM

## 2010-12-22 DIAGNOSIS — R351 Nocturia: Secondary | ICD-10-CM

## 2010-12-22 DIAGNOSIS — M899 Disorder of bone, unspecified: Secondary | ICD-10-CM

## 2010-12-22 NOTE — Progress Notes (Signed)
Subjective:     Patient ID: Karen Mccoy, female   DOB: 05/23/1940, 70 y.o.   MRN: [id number]  HPIpatient came back to see me today for multiple problems visit. #1 she is now complaining of vaginal dryness with dyspareunia. She is having no vaginal bleeding. She remains on Vagifem: The dose was reduced from 25 to 10 mcg it is now work as well. The second problem is she's having left lower quadrant tenderness. It is not associated with nausea, vomiting, fever, change in bowel habits, dysuria or urgency of urination. She first  Saw Dr. Alwyn Ren for this and he ordered a pelvic ultrasound which showed normal ovaries. He also did a urinalysis which was normal. She sees some association when she first got it with tennis. Is been going on now for 8 weeks. She just had her mammogram this week. She is due for followup bone density due to severe osteopenia. She previously was treated with medication but is on drug holiday.  Review of Systems12 system review of systems done. Pertinent negatives listed above. Other positives include hyperlipidemia diverticulosis, arthralgia, and jaw pain.     Objective:   Physical ExamHEENT: Within normal limits. Neck: No masses. Supraclavicular lymph nodes: Not enlarged. Breasts: Examined in both sitting and lying position. Symmetrical without skin changes or masses. Abdomen: Soft no masses guarding or rebound. No hernias. Pelvic: External within normal limits. BUS within normal limits. Vaginal examination shows good estrogen effect, no cystocele enterocele or rectocele. Cervix and uterus absent. Adnexa within normal limits. Rectovaginal confirmatory. Extremities within normal limits.      Assessment:     #1. Left lower quadrant tenderness #2. New urinary symptoms consistent with either urethritis or overactive bladder. #3. Severe osteopenia #4. Atrophic vaginitis    Plan:     We referred her to integrative therapies for treatment for myofascial pain. We may refer her  to urologist for assessment of IC pending the above. We treated her with Septra DS twice a day for 7 days for possible urethritis. We asked the patient to schedule a followup bone density. We switched her from Vagifem to estradiol cream 0.02% to use 3 times a week in the vagina.

## 2010-12-28 ENCOUNTER — Encounter: Payer: Self-pay | Admitting: Obstetrics and Gynecology

## 2010-12-31 ENCOUNTER — Other Ambulatory Visit: Payer: Self-pay | Admitting: Obstetrics and Gynecology

## 2011-01-09 ENCOUNTER — Other Ambulatory Visit: Payer: Self-pay | Admitting: *Deleted

## 2011-01-09 DIAGNOSIS — M8580 Other specified disorders of bone density and structure, unspecified site: Secondary | ICD-10-CM

## 2011-01-18 ENCOUNTER — Other Ambulatory Visit: Payer: Self-pay | Admitting: Obstetrics and Gynecology

## 2011-01-18 DIAGNOSIS — M8580 Other specified disorders of bone density and structure, unspecified site: Secondary | ICD-10-CM

## 2011-01-24 ENCOUNTER — Encounter: Payer: Self-pay | Admitting: Internal Medicine

## 2011-03-25 ENCOUNTER — Encounter: Payer: Self-pay | Admitting: Internal Medicine

## 2011-05-08 ENCOUNTER — Encounter: Payer: Managed Care, Other (non HMO) | Admitting: Internal Medicine

## 2011-05-09 ENCOUNTER — Encounter: Payer: Self-pay | Admitting: Internal Medicine

## 2011-05-09 ENCOUNTER — Ambulatory Visit (INDEPENDENT_AMBULATORY_CARE_PROVIDER_SITE_OTHER): Payer: Managed Care, Other (non HMO) | Admitting: Internal Medicine

## 2011-05-09 ENCOUNTER — Other Ambulatory Visit: Payer: Self-pay

## 2011-05-09 VITALS — BP 130/74 | HR 66 | Temp 98.5°F | Ht 62.0 in | Wt 125.0 lb

## 2011-05-09 DIAGNOSIS — E782 Mixed hyperlipidemia: Secondary | ICD-10-CM

## 2011-05-09 DIAGNOSIS — Z0000 Encounter for general adult medical examination without abnormal findings: Secondary | ICD-10-CM

## 2011-05-09 DIAGNOSIS — K5730 Diverticulosis of large intestine without perforation or abscess without bleeding: Secondary | ICD-10-CM

## 2011-05-09 DIAGNOSIS — M949 Disorder of cartilage, unspecified: Secondary | ICD-10-CM

## 2011-05-09 LAB — HEPATIC FUNCTION PANEL
ALT: 11 U/L (ref 0–35)
Total Bilirubin: 0.6 mg/dL (ref 0.3–1.2)
Total Protein: 6.7 g/dL (ref 6.0–8.3)

## 2011-05-09 LAB — CBC WITH DIFFERENTIAL/PLATELET
Eosinophils Absolute: 0.3 10*3/uL (ref 0.0–0.7)
Eosinophils Relative: 5.1 % — ABNORMAL HIGH (ref 0.0–5.0)
HCT: 45.7 % (ref 36.0–46.0)
Lymphs Abs: 1.9 10*3/uL (ref 0.7–4.0)
MCHC: 33.7 g/dL (ref 30.0–36.0)
MCV: 95.9 fl (ref 78.0–100.0)
Monocytes Absolute: 0.5 10*3/uL (ref 0.1–1.0)
Neutrophils Relative %: 57.2 % (ref 43.0–77.0)
Platelets: 246 10*3/uL (ref 150.0–400.0)
WBC: 6.3 10*3/uL (ref 4.5–10.5)

## 2011-05-09 LAB — LIPID PANEL
Cholesterol: 249 mg/dL — ABNORMAL HIGH (ref 0–200)
HDL: 78.5 mg/dL (ref >39.00)
Total CHOL/HDL Ratio: 3
Triglycerides: 83 mg/dL (ref 0.0–149.0)

## 2011-05-09 LAB — BASIC METABOLIC PANEL
BUN: 16 mg/dL (ref 6–23)
CO2: 30 mEq/L (ref 19–32)
Chloride: 106 mEq/L (ref 96–112)
Creatinine, Ser: 0.9 mg/dL (ref 0.4–1.2)
Glucose, Bld: 86 mg/dL (ref 70–99)
Potassium: 4.1 mEq/L (ref 3.5–5.1)

## 2011-05-09 NOTE — Progress Notes (Signed)
Subjective:    Patient ID: Karen Mccoy, female    DOB: Aug 09, 1940, 71 y.o.   MRN: [id number]  HPI Medicare Wellness Visit:  The following psychosocial & medical history were reviewed as required by Medicare.   Social history: caffeine: 2 cups coffee / day , alcohol: 7/ week ,  tobacco use : 3 cigarettes / day  & exercise : 3-4X/ week as tennis   Home & personal  safety / fall risk: no issues, activities of daily living:no limitations , seatbelt use : yes , and smoke alarm employment :yes.  Power of Attorney/Living Will status : in place  Vision ( as recorded per Nurse) & Hearing  evaluation :  See exam Orientation :oriented X 3, memory & recall :good, spelling  testing: good,and mood & affect : normal . Depression / anxiety: not significantly Travel history : 2012 [place] , immunization status :all up to date , transfusion history: no, and preventive health surveillance ( colonoscopies, BMD , etc as per protocol/ [facility]): colonoscopy up to date, Dental care: every 6 mos. Chart reviewed &  Updated. Active issues reviewed & addressed.       Review of Systems  Patient reports no significant  vision/ hearing  changes, adenopathy,fever, weight change,  persistant / recurrent hoarseness , swallowing issues, chest pain,palpitations,edema,persistant /recurrent cough, hemoptysis, dyspnea( rest/ exertional/paroxysmal nocturnal), gastrointestinal bleeding(melena, rectal bleeding), abdominal pain, significant heartburn,  bowel changes,GU symptoms(dysuria, hematuria,pyuria, incontinence), Gyn symptoms(abnormal  bleeding , pain),  syncope, focal weakness, memory loss,numbness & tingling, skin/hair /nail changes,abnormal bruising or bleeding. She has noted increase "gas" but no other GI symptoms or bloating     Objective:   Physical Exam Gen.: Healthy and well-nourished in appearance. Alert, appropriate and cooperative throughout exam. Head: Normocephalic without obvious abnormalities  Eyes: No corneal or  conjunctival inflammation noted. Pupils equal round reactive to light and accommodation. Fundal exam is benign without hemorrhages, exudate, papilledema. Extraocular motion intact. Vision grossly normal. Ears: External  ear exam reveals no significant lesions or deformities. Canals clear .TMs normal. Hearing is grossly normal bilaterally. Nose: External nasal exam reveals no deformity or inflammation. Nasal mucosa are pink and moist. No lesions or exudates noted. Mouth: Oral mucosa and oropharynx reveal no lesions or exudates. Teeth in good repair. Neck: No deformities, masses, or tenderness noted. Range of motion & Thyroid normal. Lungs: Normal respiratory effort; chest expands symmetrically. Lungs are clear to auscultation without rales, wheezes, or increased work of breathing. Heart: Normal rate and rhythm. Normal S1 and S2. No gallop, click, or rub. S4 with slurring at  LSB ; no murmur. Abdomen: Bowel sounds normal; abdomen soft and nontender. No masses, organomegaly or hernias noted. Genitalia: Dr Eda Paschal.                                                                                   Musculoskeletal/extremities: No deformity or scoliosis noted of  the thoracic or lumbar spine. No clubbing, cyanosis, edema, or deformity noted. Range of motion  normal .Tone & strength  normal.Joints normal. Nail health  good. Vascular: Carotid, radial artery, dorsalis pedis and  posterior tibial pulses are full and equal. Aortic  bruit present w/o  AAA. Neurologic: Alert and oriented x3. Deep tendon reflexes symmetrical and normal.          Skin: Intact without suspicious lesions or rashes. Lymph: No cervical, axillary lymphadenopathy present. Psych: Mood and affect are normal. Normally interactive                                                                                         Assessment & Plan:  #1 Medicare Wellness Exam; criteria met ; data entered #2 Problem List reviewed ; Assessment/  Recommendations made  #3 increase "gas", probably dietary. A trial of probiotics will be recommended if dietary changes are not beneficial Plan: see Orders

## 2011-05-09 NOTE — Patient Instructions (Signed)
Please take the probiotic , Align, every day until the gas resolves. This will replace the normal bacteria which  are necessary for formation of normal stool and processing of food. Please keep a food diary of possible food triggers for the gas. Common food triggers for bowel dysfunction include lactose (milk sugar) or wheat / gluten which can cause Sprue.  Exercise  30-45  minutes a day, 3-4 days a week. Walking is especially valuable in preventing Osteoporosis. Eat a low-fat diet with lots of fruits and vegetables, up to 7-9 servings per day. Consume less than 30 grams of sugar per day from foods & drinks with High Fructose Corn Syrup as # 1,2,3 or #4 on label. Consider  [facility]'s smoking cessation program @ [email or web address].com or (315)071-1822.   Please take enteric-coated aspirin 81 mg daily with breakfast.

## 2011-05-16 NOTE — Progress Notes (Signed)
  Subjective:    Patient ID: MONCHEL POLLITT, female    DOB: [birth year], 71 y.o.   MRN: [id number]  HPI    Review of Systems     Objective:   Physical Exam        Assessment & Plan:  This imaging study was completed ; report on file in EMR

## 2011-07-22 NOTE — Progress Notes (Signed)
Addended by: LOWNE, Delisha Peaden R on: 07/22/2011 09:51 PM   Modules accepted: Orders  

## 2011-07-23 ENCOUNTER — Other Ambulatory Visit: Payer: Self-pay | Admitting: Family Medicine

## 2011-07-23 DIAGNOSIS — R140 Abdominal distension (gaseous): Secondary | ICD-10-CM

## 2011-07-23 DIAGNOSIS — R1032 Left lower quadrant pain: Secondary | ICD-10-CM

## 2011-07-31 ENCOUNTER — Other Ambulatory Visit (HOSPITAL_COMMUNITY): Payer: Medicare Other

## 2011-07-31 ENCOUNTER — Ambulatory Visit (HOSPITAL_COMMUNITY): Payer: Managed Care, Other (non HMO)

## 2011-11-14 ENCOUNTER — Encounter: Payer: Self-pay | Admitting: Gastroenterology

## 2011-12-10 ENCOUNTER — Other Ambulatory Visit: Payer: Self-pay | Admitting: *Deleted

## 2011-12-10 MED ORDER — ESTRADIOL 0.1 MG/GM VA CREA: 2.0000 g | TOPICAL_CREAM | VAGINAL | Status: DC

## 2011-12-14 ENCOUNTER — Telehealth: Payer: Self-pay | Admitting: Gastroenterology

## 2011-12-14 NOTE — Telephone Encounter (Signed)
Colonoscopy 11/22/03 indicates diverticulosis, tortuosus colon - moderately difficult exam, and  Repeat colon in  For 2011.  She reports no family history, personal history of polyps, or GI symptoms at this time.  She wonders why a 5 year recall?  I will place a copy of the colon report on your desk .  Please advise

## 2011-12-17 NOTE — Telephone Encounter (Signed)
I reviewed the colonoscopy report. Current guidelines call for 10 year routine screening colonoscopy so please change her recall to 11/2013.

## 2011-12-17 NOTE — Telephone Encounter (Signed)
Left message for patient to call back  New recall entered for 11/2013

## 2011-12-18 NOTE — Telephone Encounter (Signed)
Patient advised.

## 2012-01-01 ENCOUNTER — Encounter: Payer: Self-pay | Admitting: Obstetrics and Gynecology

## 2012-01-01 ENCOUNTER — Ambulatory Visit (INDEPENDENT_AMBULATORY_CARE_PROVIDER_SITE_OTHER): Payer: Medicare Other | Admitting: Obstetrics and Gynecology

## 2012-01-01 VITALS — BP 124/70 | Ht 63.0 in | Wt 123.0 lb

## 2012-01-01 DIAGNOSIS — R1032 Left lower quadrant pain: Secondary | ICD-10-CM

## 2012-01-01 DIAGNOSIS — N952 Postmenopausal atrophic vaginitis: Secondary | ICD-10-CM

## 2012-01-01 DIAGNOSIS — M949 Disorder of cartilage, unspecified: Secondary | ICD-10-CM

## 2012-01-01 DIAGNOSIS — M8580 Other specified disorders of bone density and structure, unspecified site: Secondary | ICD-10-CM

## 2012-01-01 NOTE — Patient Instructions (Signed)
Continue yearly mammograms 

## 2012-01-01 NOTE — Progress Notes (Signed)
Patient came to see me today for further followup. She has had atrophic vaginitis with dyspareunia. We are using estradiol vaginal cream which is compounded for her. We initially started her on 3 times a week but she finds that she needs to take it 4  times a week. She is having no vaginal bleeding. She has what she described as numbness today but tenderness a year ago in her left lower quadrant which is persistent. Pelvic ultrasound failed to reveal ovarian masses. CT scan and other tests were normal. It is a minor inconvenience. It is not associated with nausea vomiting or change in bowel habits. She has a history of osteopenia. She was on Fosamax from 2005 to 2009 and has had stability on bone density without fractures.She can get some nocturia without urgency or incontinence.Patient is never had any abnormal Pap smears. Her last Pap smear was 2011.  ROS: 12 system review done. Pertinent positives above. Other positives include hyperlipidemia, arthralgia, diverticulosis.  HEENT: Within normal limits.Kennon Portela present. Neck: No masses. Supraclavicular lymph nodes: Not enlarged. Breasts: Examined in both sitting and lying position. Symmetrical without skin changes or masses. Abdomen: Soft no masses guarding or rebound. No hernias. Pelvic: External within normal limits. BUS within normal limits. Vaginal examination shows good estrogen effect, no cystocele enterocele or rectocele. Cervix and uterus absent. Adnexa within normal limits. Rectovaginal confirmatory. Extremities within normal limits.  Assessment: #1. Atrophic vaginitis #2. Osteopenia #3. Left lower quadrant numbness #4. Nocturia  Plan: Continue estradiol vaginal cream 0.02% 1 mL applicator 4 times a week. Continue yearly mammograms. Continue periodic bone densities. Past not done.The new Pap smear guidelines were discussed with the patient.

## 2012-01-25 ENCOUNTER — Other Ambulatory Visit: Payer: Self-pay | Admitting: Obstetrics and Gynecology

## 2012-04-26 ENCOUNTER — Other Ambulatory Visit: Payer: Self-pay

## 2012-05-12 ENCOUNTER — Ambulatory Visit (INDEPENDENT_AMBULATORY_CARE_PROVIDER_SITE_OTHER): Payer: Self-pay | Admitting: Internal Medicine

## 2012-05-12 ENCOUNTER — Encounter: Payer: Self-pay | Admitting: Internal Medicine

## 2012-05-12 VITALS — BP 122/78 | HR 67 | Temp 97.9°F | Resp 12 | Ht 63.03 in | Wt 121.0 lb

## 2012-05-12 DIAGNOSIS — M899 Disorder of bone, unspecified: Secondary | ICD-10-CM

## 2012-05-12 DIAGNOSIS — Z0000 Encounter for general adult medical examination without abnormal findings: Secondary | ICD-10-CM

## 2012-05-12 DIAGNOSIS — M949 Disorder of cartilage, unspecified: Secondary | ICD-10-CM

## 2012-05-12 DIAGNOSIS — R05 Cough: Secondary | ICD-10-CM

## 2012-05-12 DIAGNOSIS — K5730 Diverticulosis of large intestine without perforation or abscess without bleeding: Secondary | ICD-10-CM

## 2012-05-12 DIAGNOSIS — E785 Hyperlipidemia, unspecified: Secondary | ICD-10-CM

## 2012-05-12 LAB — CBC WITH DIFFERENTIAL/PLATELET
Basophils Absolute: 0.1 10*3/uL (ref 0.0–0.1)
Eosinophils Absolute: 0.3 10*3/uL (ref 0.0–0.7)
HCT: 44.5 % (ref 36.0–46.0)
Hemoglobin: 14.9 g/dL (ref 12.0–15.0)
Lymphs Abs: 2.1 10*3/uL (ref 0.7–4.0)
MCHC: 33.4 g/dL (ref 30.0–36.0)
MCV: 96 fl (ref 78.0–100.0)
Monocytes Absolute: 0.5 10*3/uL (ref 0.1–1.0)
Neutro Abs: 4.2 10*3/uL (ref 1.4–7.7)
RDW: 14 % (ref 11.5–14.6)

## 2012-05-12 LAB — BASIC METABOLIC PANEL
BUN: 16 mg/dL (ref 6–23)
CO2: 31 mEq/L (ref 19–32)
Glucose, Bld: 83 mg/dL (ref 70–99)
Potassium: 3.6 mEq/L (ref 3.5–5.1)
Sodium: 142 mEq/L (ref 135–145)

## 2012-05-12 LAB — LIPID PANEL
Cholesterol: 215 mg/dL — ABNORMAL HIGH (ref 0–200)
HDL: 79.6 mg/dL (ref >39.00)
Triglycerides: 68 mg/dL (ref 0.0–149.0)
VLDL: 13.6 mg/dL (ref 0.0–40.0)

## 2012-05-12 LAB — HEPATIC FUNCTION PANEL
AST: 18 U/L (ref 0–37)
Albumin: 4.2 g/dL (ref 3.5–5.2)

## 2012-05-12 MED ORDER — FLUTICASONE PROPIONATE 50 MCG/ACT NA SUSP: 1.0000 | Freq: Two times a day (BID) | NASAL | Status: AC | PRN

## 2012-05-12 NOTE — Progress Notes (Signed)
Subjective:    Patient ID: Karen Mccoy, female    DOB: 12-18-1940, 72 y.o.   MRN: [id number]  HPI Medicare Wellness Visit:  Psychosocial & medical history were reviewed as required by Medicare (abuse,antisocial behavioral risks,firearm risk).  Social history: caffeine: 2 cups coffee , alcohol:7 shots/ week   ,  tobacco use: now 3-4 cigarettes   Exercise :  Tennis 3-4 X/ week for 90 min & walking 2 miles No home & personal  safety / fall risk Activities of daily living: no limitations  Seatbelt  and smoke alarm employed. Power of Attorney/Living Will status : in place Ophthalmology exam current Hearing evaluation not current Orientation :oriented X 3  Memory & recall :good Spelling  testing:good Mood & affect : normal . Depression / anxiety: denied Travel history : last 2011 [place]  Immunization status :current : Shingles /Flu/ PNA/ tetanus Transfusion history:  none  Preventive health surveillance ( colonoscopies, BMD , mammograms,PAP as per protocol/ SOC): current / colonoscopy / mammogram / PAP . BMD ? Due this year. Dental care:  Every 6 mos. Chart reviewed &  Updated. Active issues reviewed & addressed.      Review of Systems She describes a chronic cough for years; this has exacerbated this year. It mainly occurs at night and may be associated with upper respiratory tract congestion and drainage. The cough is dry without purulence or hemoptysis. She denies fever, chills, or sweats. She has no associated extrinsic symptoms. She has no history of asthma or reactive airways disease. She does smoke 3-4 cigarettes a day.    Objective:   Physical Exam  Gen.: Healthy and well-nourished in appearance. Alert, appropriate and cooperative throughout exam. Appears younger than stated age  Head: Normocephalic without obvious abnormalities  Eyes: No corneal or conjunctival inflammation noted. Pupils equal round reactive to light and accommodation.  Extraocular motion intact. Vision  grossly normal with lenses Ears: External  ear exam reveals no significant lesions or deformities. Canals clear .TMs normal. Hearing is grossly decreased on L. Nose: External nasal exam reveals no deformity or inflammation. Nasal mucosa are pink and moist. No lesions or exudates noted.   Mouth: Oral mucosa and oropharynx reveal no lesions or exudates. Teeth in good repair. Neck: No deformities, masses, or tenderness noted. Range of motion normal. Thyroid small but slightly granular in texture. Rule out small nodule on right.(see [place] 2009). Lungs: Normal respiratory effort; chest expands symmetrically. Lungs are clear to auscultation without rales, wheezes, or increased work of breathing. Heart: Normal rate and rhythm. Normal S1 and S2. No gallop, click, or rub. S4 w/o murmur. Abdomen: Bowel sounds normal; abdomen soft and nontender. No masses, organomegaly or hernias noted. Genitalia: As per Gyn                                  Musculoskeletal/extremities: There is some asymmetry of the posterior thoracic musculature suggesting occult scoliosis. No clubbing, cyanosis, edema, or significant extremity  deformity noted. Range of motion normal .Tone & strength  Normal. Joints normal . Nail health good. Able to lie down & sit up w/o help. Negative SLR bilaterally Vascular: Carotid, radial artery, dorsalis pedis and  posterior tibial pulses are full and equal. No bruits present. Neurologic: Alert and oriented x3. Deep tendon reflexes symmetrical and normal.         Skin: Intact without suspicious lesions or rashes. Lymph: No cervical, axillary lymphadenopathy present.  Psych: Mood and affect are normal. Normally interactive                                                                                       Assessment & Plan:  #1 Medicare Wellness Exam; criteria met ; data entered #2 Problem List reviewed ; Assessment/ Recommendations made #3 cough , most likely from upper airway drainage Plan:  see Orders

## 2012-05-12 NOTE — Patient Instructions (Addendum)
Plain Mucinex (NOT D) for thick secretions ;force NON dairy fluids .   Nasal cleansing in the shower as discussed with lather of mild shampoo.After 10 seconds wash off lather while  exhaling through nostrils. Make sure that all residual soap is removed to prevent irritation.  Fluticasone 1 spray in each nostril twice a day as needed. Use the "crossover" technique into opposite nostril spraying toward opposite ear @ 45 degree angle, not straight up into nostril.  Use a Neti pot daily only  as needed for significant sinus congestion; going from open side to congested side . Plain Allegra (NOT D )  160 daily , Loratidine 10 mg , OR Zyrtec 10 mg @ bedtime  as needed for itchy eyes & sneezing. Order for x-rays entered into  the computer; these will be performed at [address] [facility] across from [facility]. No appointment is necessary.

## 2012-05-13 ENCOUNTER — Ambulatory Visit (INDEPENDENT_AMBULATORY_CARE_PROVIDER_SITE_OTHER)
Admission: RE | Admit: 2012-05-13 | Discharge: 2012-05-13 | Disposition: A | Discharge status: Home / Self Care | Payer: Medicare Other | Source: Ambulatory Visit | Attending: Internal Medicine | Admitting: Internal Medicine

## 2012-05-13 DIAGNOSIS — R05 Cough: Secondary | ICD-10-CM

## 2012-10-01 ENCOUNTER — Other Ambulatory Visit: Payer: Self-pay | Admitting: Dermatology

## 2012-11-07 ENCOUNTER — Encounter: Payer: Self-pay | Admitting: Internal Medicine

## 2012-11-24 ENCOUNTER — Ambulatory Visit (INDEPENDENT_AMBULATORY_CARE_PROVIDER_SITE_OTHER): Payer: Medicare Other | Admitting: Internal Medicine

## 2012-11-24 ENCOUNTER — Encounter: Payer: Self-pay | Admitting: Internal Medicine

## 2012-11-24 VITALS — BP 137/78 | HR 67 | Temp 98.4°F | Wt 126.6 lb

## 2012-11-24 DIAGNOSIS — R42 Dizziness and giddiness: Secondary | ICD-10-CM

## 2012-11-24 DIAGNOSIS — R209 Unspecified disturbances of skin sensation: Secondary | ICD-10-CM

## 2012-11-24 DIAGNOSIS — R202 Paresthesia of skin: Secondary | ICD-10-CM

## 2012-11-24 LAB — BASIC METABOLIC PANEL
CO2: 29 mEq/L (ref 19–32)
Calcium: 8.8 mg/dL (ref 8.4–10.5)
Glucose, Bld: 82 mg/dL (ref 70–99)
Sodium: 140 mEq/L (ref 135–145)

## 2012-11-24 LAB — TSH: TSH: 1.26 u[IU]/mL (ref 0.35–5.50)

## 2012-11-24 NOTE — Progress Notes (Signed)
Subjective:    Patient ID: Karen Mccoy, female    DOB: [birth year], 72 y.o.   MRN: [id number]  HPI   She has had intermittent tingling of her feet over the last 6 months without specific trigger or initiating factor. It can occur up to 3 times per day and last minutes. He can  recur over a 2 to three-day period ; but may be absent for 1-2 days.  She has no other neurologic symptoms except for some lightheadedness when she arises from a bed or chair.  There is no past history of syncope or seizure. She also denies any history of head injury.  Review of Systems  She specifically denies vertiginous symptoms or near syncope. The lightheadedness is not related to rotation of her head or extension of her neck.  She denies any cardiac prodrome such as change in regular rhythm.  She has no associated headache, limb weakness or numbness.    She's had no associated hearing loss, tinnitus, blurred vision, double vision, or loss of vision.     Objective:   Physical Exam Gen.: Healthy and well-nourished in appearance. Alert, appropriate and cooperative throughout exam. Appears younger than stated age  Head: Normocephalic without obvious abnormalities Eyes: No corneal or conjunctival inflammation noted. Pupils equal round reactive to light and accommodation. FOV normal. Extraocular motion intact. Vision grossly normal with lenses. No lid lag. No nystagmus present Ears: External  ear exam reveals no significant lesions or deformities. Canals clear .TMs normal. Hearing is grossly normal bilaterally. Nose: External nasal exam reveals no deformity or inflammation. Nasal mucosa are pink and moist. No lesions or exudates noted.   Mouth: Oral mucosa and oropharynx reveal no lesions or exudates. Teeth in good repair. Neck: No deformities, masses, or tenderness noted. Range of motion normal. Lungs: Normal respiratory effort; chest expands symmetrically. Lungs are clear to auscultation without rales,  wheezes, or increased work of breathing. Heart: Normal rate and rhythm. Normal S1 and S2. No gallop, click, or rub. No murmur.                                 Musculoskeletal/extremities: No clubbing, cyanosis, edema, or significant extremity  deformity noted. Range of motion normal .Tone & strength  Normal. Joints normal . Nail health good. Able to lie down & sit up w/o help.  Vascular: Carotid, radial artery, dorsalis pedis and  posterior tibial pulses are full and equal. No bruits present. Neurologic: Alert and oriented x3. Deep tendon reflexes symmetrical and normal. No cranial nerve deficit present. Romberg testing and finger to nose testing normal. Light touch over feet normal         Skin: Intact without suspicious lesions or rashes. Lymph: No cervical, axillary lymphadenopathy present. Psych: Mood and affect are normal. Normally interactive                                                                                       Assessment & Plan:  #1 paresthesias, tingling in the feet.  #2 postural lightheadedness most likely related to postural hypotension  Plan see orders  and recommendations

## 2012-11-24 NOTE — Patient Instructions (Addendum)
I recommend a Neurology consultation to determine optimal therapy if labs are negative and symptoms persist or progress. Repeat the isometric exercises discussed 4- 5 times prior to standing if you've been seated for a period of time.

## 2013-01-15 ENCOUNTER — Other Ambulatory Visit: Payer: Self-pay

## 2013-09-10 IMAGING — US US PELVIS COMPLETE
1 series · 14 of 25 positions shown · non-contrast
Comparison: 0440

CLINICAL DATA: Bloating for 4 months.  Tender left lower quadrant.
Post hysterectomy for fibroids.

TRANSABDOMINAL AND TRANSVAGINAL ULTRASOUND OF PELVIS
TECHNIQUE: Both transabdominal and transvaginal ultrasound
examinations of the pelvis were performed. Transabdominal technique
was performed for global imaging of the pelvis including uterus,
ovaries, adnexal regions, and pelvic cul-de-sac.

[Series 1: us pelvis complete · 14 of 35 slices shown]
[im 1/35]
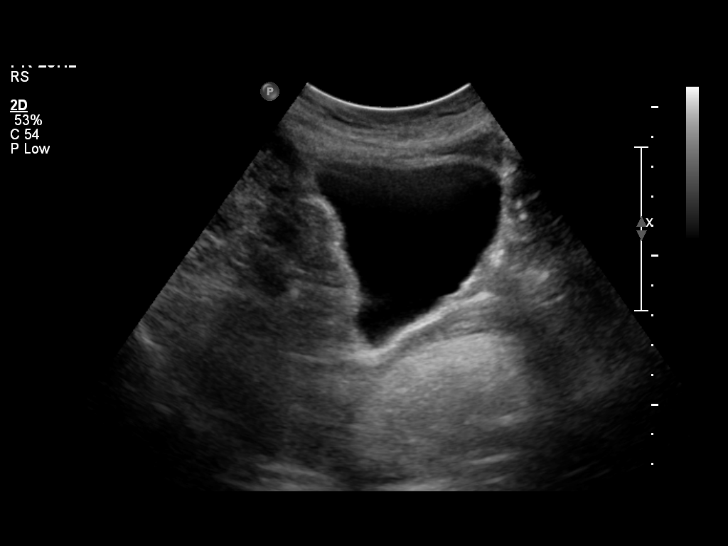
[im 3/35]
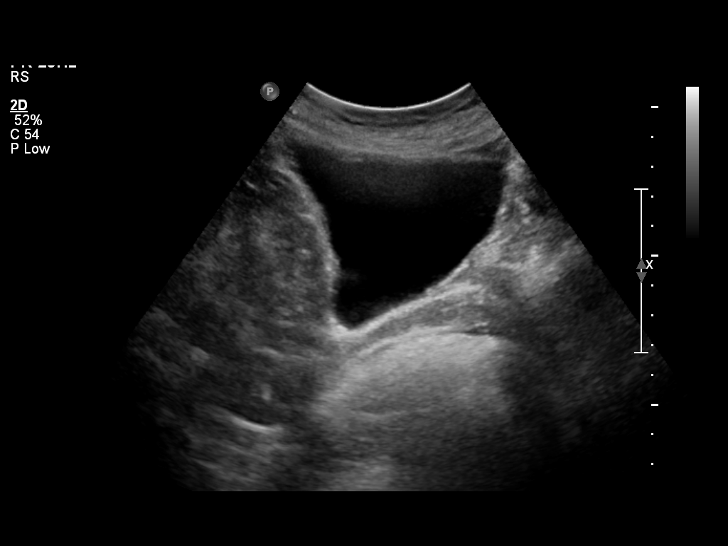
[im 6/35]
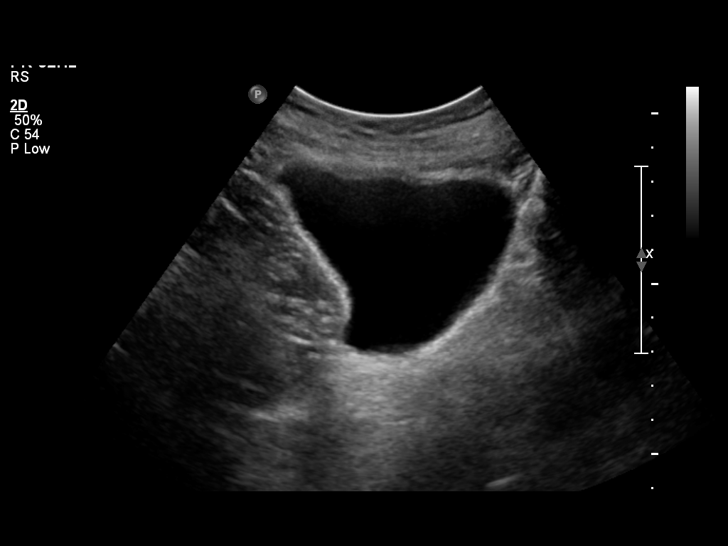
[im 9/35]
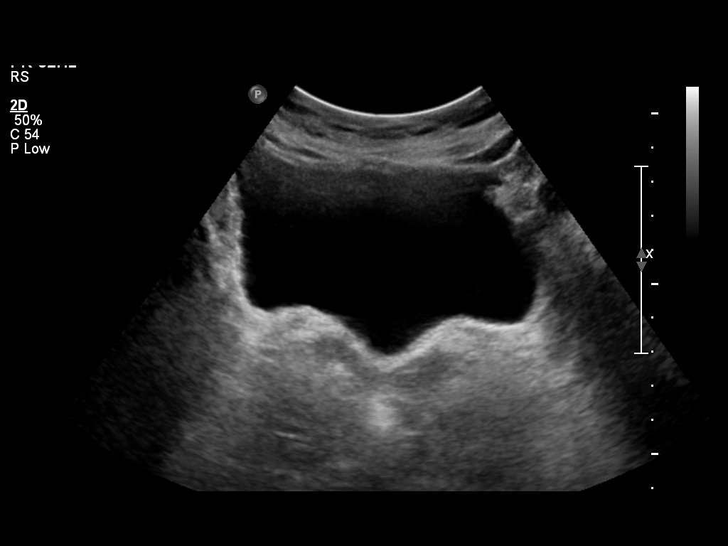
[im 12/35]
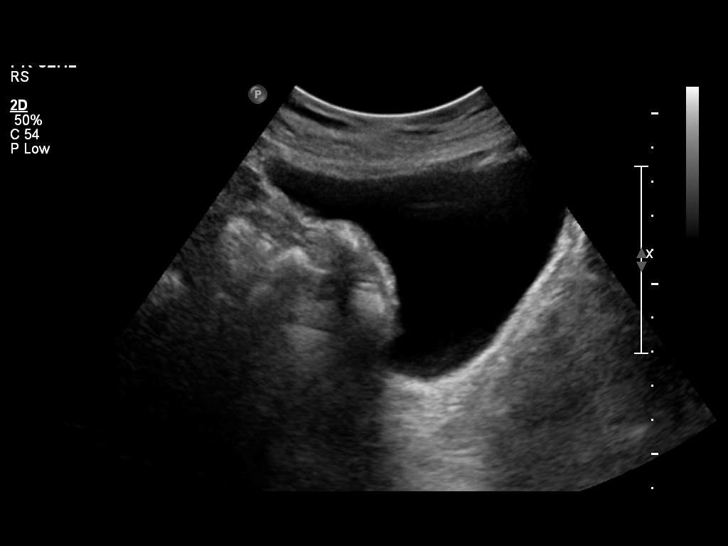
[im 13/35]
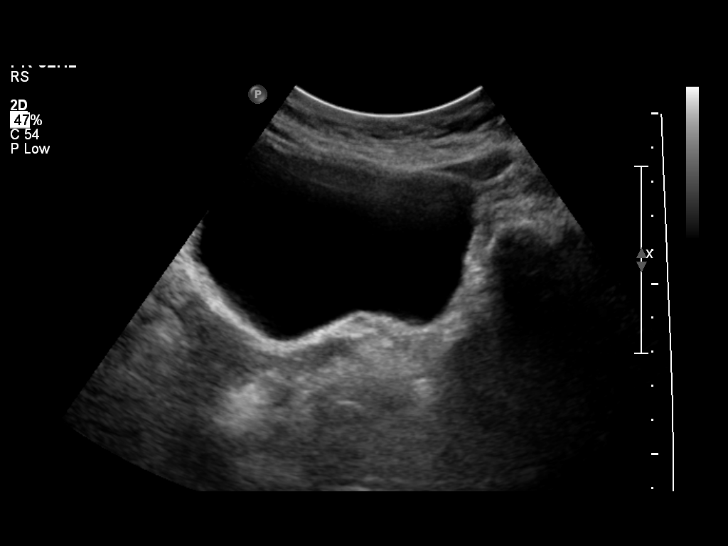
[im 16/35]
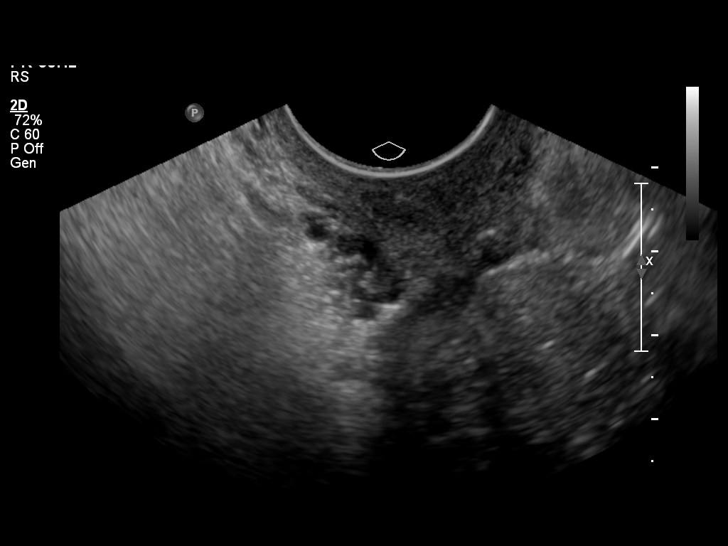
[im 19/35]
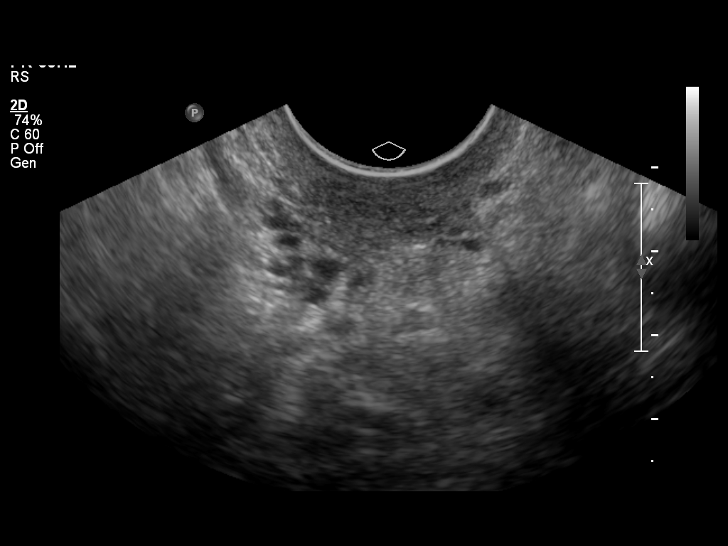
[im 22/35]
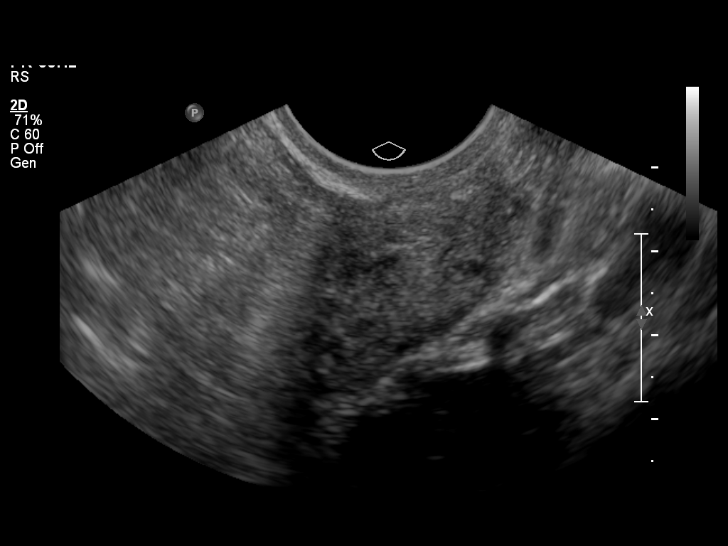
[im 23/35]
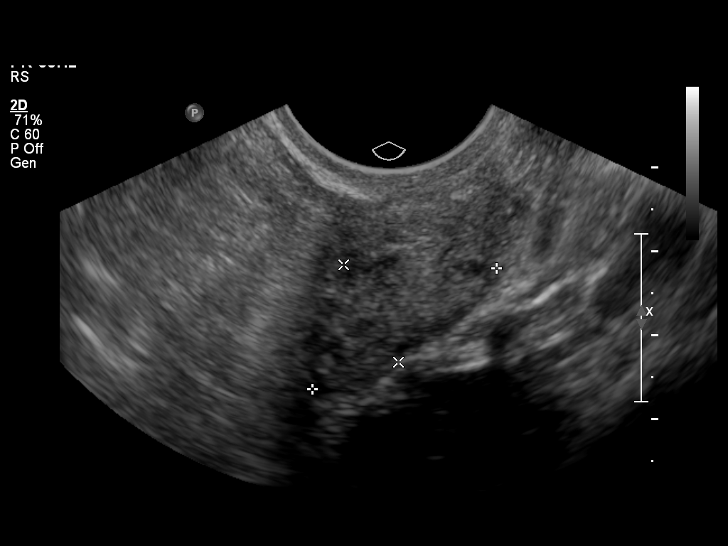
[im 26/35]
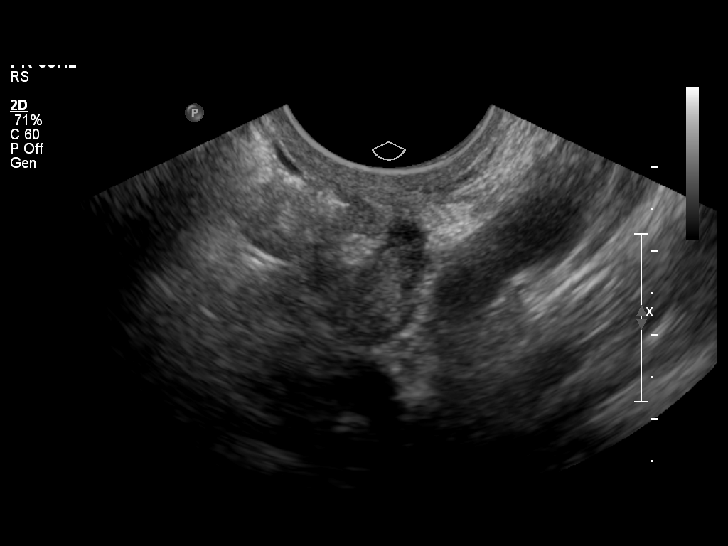
[im 29/35]
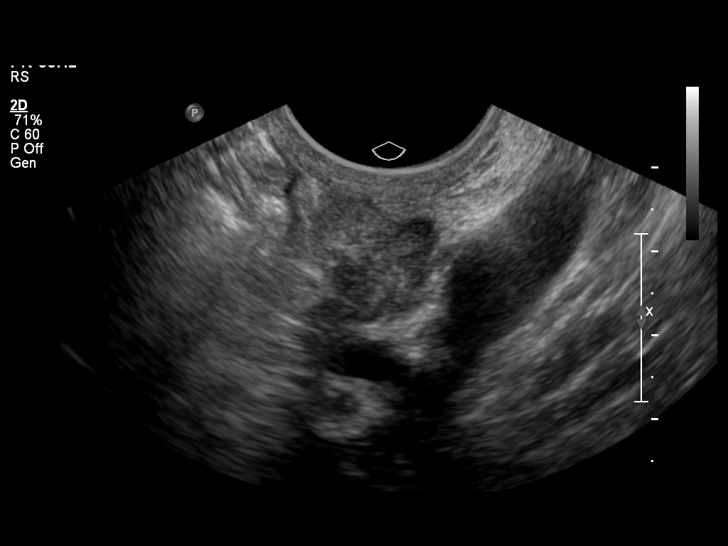
[im 32/35]
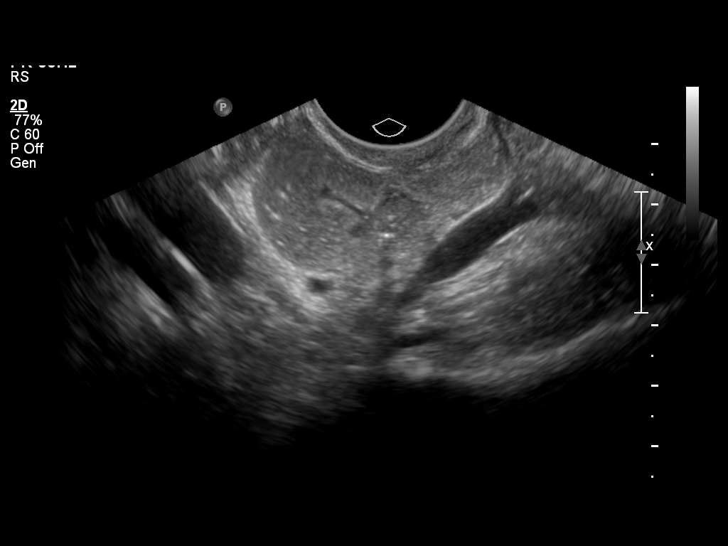
[im 35/35]
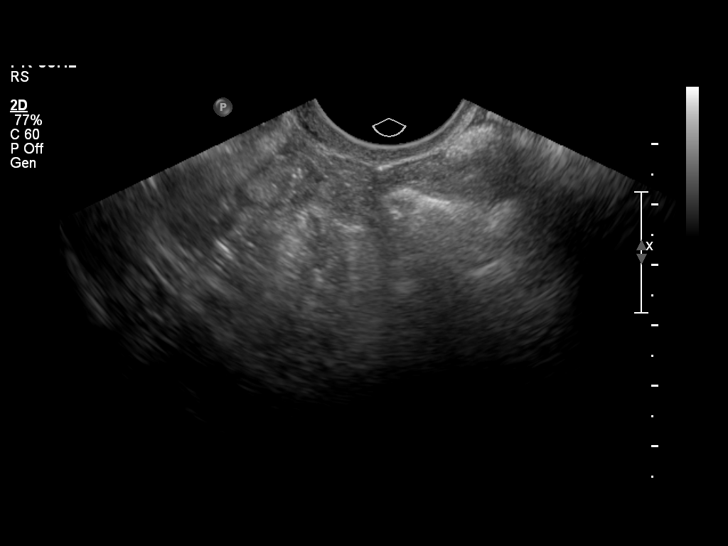

[14 of 25 positions shown; findings below may reference images not displayed]

It was necessary to proceed with endovaginal exam following the
transabdominal exam to visualize the vaginal cuff and adnexa.
FINDINGS: Uterus: Has been surgically removed.  A normal vaginal cuff is
noted

Endometrium: Not applicable

Right ovary:  Is not seen with confidence either transabdominally
or endovaginally

Left ovary: Measures 1.2 x 2.6 x 1.3 cm and has a normal appearance

Other findings: No pelvic fluid is seen.  No separate adnexal
masses are noted
IMPRESSION: Normal vaginal cuff and left ovary.  Non-visualized right ovary.

## 2013-09-25 ENCOUNTER — Encounter: Payer: Self-pay | Admitting: Gastroenterology

## 2013-10-02 ENCOUNTER — Telehealth: Payer: Self-pay | Admitting: Gastroenterology

## 2013-10-02 NOTE — Telephone Encounter (Signed)
Pt called on 10-02-13 she says she moved to [facility] and would like [place] to stop sending her recall letters/letters.

## 2013-12-21 ENCOUNTER — Encounter: Payer: Self-pay | Admitting: Physician Assistant

## 2014-01-11 ENCOUNTER — Encounter: Payer: Self-pay | Admitting: Internal Medicine

## 2015-07-11 DEATH — deceased
